# Patient Record
Sex: Male | Born: 1995 | Race: Black or African American | Hispanic: No | Marital: Single | Smoking: Never smoker
Health system: Southern US, Community
[De-identification: ages and names within clinical notes are randomized; demographics above are authoritative.]

---
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
## 2015-05-21 ENCOUNTER — Other Ambulatory Visit: Payer: Self-pay | Admitting: Family Medicine

## 2023-02-26 ENCOUNTER — Emergency Department
Admission: EM | Admit: 2023-02-26 | Discharge: 2023-02-26 | Disposition: A | Discharge status: Home / Self Care | Payer: Self-pay | Attending: Emergency Medicine | Admitting: Emergency Medicine

## 2023-02-26 ENCOUNTER — Encounter: Payer: Self-pay | Admitting: Emergency Medicine

## 2023-02-26 ENCOUNTER — Other Ambulatory Visit: Payer: Self-pay

## 2023-02-26 DIAGNOSIS — L731 Pseudofolliculitis barbae: Secondary | ICD-10-CM | POA: Insufficient documentation

## 2023-02-26 DIAGNOSIS — L02416 Cutaneous abscess of left lower limb: Secondary | ICD-10-CM | POA: Insufficient documentation

## 2023-02-26 DIAGNOSIS — L0291 Cutaneous abscess, unspecified: Secondary | ICD-10-CM

## 2023-02-26 MED ORDER — CEPHALEXIN 500 MG PO CAPS: 500.0000 mg | ORAL_CAPSULE | Freq: Three times a day (TID) | ORAL | 0 refills | Status: AC

## 2023-02-26 NOTE — ED Notes (Signed)
66 yom with a c/c of a bump on his buttocks for the last 12 hours. The pt was ambulatory to the room.

## 2023-02-26 NOTE — ED Triage Notes (Signed)
Pt via POV from home. Pt c/o abscess on the L side of his gluteus. States he noticed it yesterday. Report pain and purulent drainage. Pt is A&OX4 and NAD

## 2023-02-26 NOTE — ED Provider Notes (Signed)
[facility] Provider Note    Event Date/Time   First MD Initiated Contact with Patient 02/26/23 [phone]     (approximate)   History   Abscess   HPI  Timothy Collins is a 27 y.o. male with no significant past medical history presents emergency department with concerns of an abscess on the back of the left leg.  Patient states he had some yellow to green drainage out of it this morning.  Thought it looked more like a pimple but was not sure as he could not see back there.  No fever or chills.  States area is painful      Physical Exam   Triage Vital Signs: ED Triage Vitals  Enc Vitals Group     BP 02/26/23 0922 (!) 133/90     Pulse Rate 02/26/23 0922 88     Resp 02/26/23 0922 18     Temp 02/26/23 0922 98.7 F (37.1 C)     Temp Source 02/26/23 0922 Oral     SpO2 02/26/23 0922 99 %     Weight 02/26/23 0919 130 lb (59 kg)     Height 02/26/23 0919 '6\' 1"'$  (1.854 m)     Head Circumference --      Peak Flow --      Pain Score 02/26/23 0919 10     Pain Loc --      Pain Edu? --      Excl. in [place]? --     Most recent vital signs: Vitals:   02/26/23 0922  BP: (!) 133/90  Pulse: 88  Resp: 18  Temp: 98.7 F (37.1 C)  SpO2: 99%     General: Awake, no distress.   CV:  Good peripheral perfusion. regular rate and  rhythm Resp:  Normal effort. Abd:  No distention.   Other:  Skin with small ingrown hair noted on the gluteal cleft, no drainage at this time, area is not indurated   ED Results / Procedures / Treatments   Labs (all labs ordered are listed, but only abnormal results are displayed) Labs Reviewed - No data to display   EKG     RADIOLOGY     PROCEDURES:   Procedures   MEDICATIONS ORDERED IN ED: Medications - No data to display   IMPRESSION / MDM / [place] / ED COURSE  I reviewed the triage vital signs and the nursing notes.                              Differential diagnosis includes, but is not limited to,  abscess, ingrown hair, herpes  Patient's presentation is most consistent with acute, uncomplicated illness.   Very small ingrown hair that appears to be infected noted at the gluteal cleft.  Patient was placed on Keflex.  He is soak in a tub of warm water.  Use a loofah on the area.  Return emergency department worsening.  See his regular doctor if not improving 3 days.  In agreement treatment plan.  Discharged stable condition.      FINAL CLINICAL IMPRESSION(S) / ED DIAGNOSES   Final diagnoses:  Abscess     Rx / DC Orders   ED Discharge Orders          Ordered    cephALEXin (KEFLEX) 500 MG capsule  3 times daily        02/26/23 0948  Note:  This document was prepared using Dragon voice recognition software and may include unintentional dictation errors.    Versie Starks, PA-C 02/26/23 [id number]    Carrie Mew, MD 02/26/23 [phone]
